# Patient Record
Sex: Male | Born: 1955 | Race: White | Hispanic: No | Marital: Married | Smoking: Former smoker
Health system: Southern US, Community
[De-identification: ages and names within clinical notes are randomized; demographics above are authoritative.]

## PROBLEM LIST (undated history)

## (undated) DIAGNOSIS — E785 Hyperlipidemia, unspecified: Secondary | ICD-10-CM

## (undated) DIAGNOSIS — G43909 Migraine, unspecified, not intractable, without status migrainosus: Secondary | ICD-10-CM

## (undated) DIAGNOSIS — I10 Essential (primary) hypertension: Secondary | ICD-10-CM

## (undated) DIAGNOSIS — S72009A Fracture of unspecified part of neck of unspecified femur, initial encounter for closed fracture: Secondary | ICD-10-CM

## (undated) HISTORY — DX: Fracture of unspecified part of neck of unspecified femur, initial encounter for closed fracture: S72.009A

## (undated) HISTORY — PX: WISDOM TOOTH EXTRACTION: SHX21

## (undated) HISTORY — PX: TONSILLECTOMY: SUR1361

## (undated) HISTORY — DX: Hyperlipidemia, unspecified: E78.5

## (undated) HISTORY — DX: Essential (primary) hypertension: I10

## (undated) HISTORY — DX: Migraine, unspecified, not intractable, without status migrainosus: G43.909

---

## 1983-05-13 HISTORY — PX: VASECTOMY: SHX75

## 1993-05-12 DIAGNOSIS — S72009A Fracture of unspecified part of neck of unspecified femur, initial encounter for closed fracture: Secondary | ICD-10-CM

## 1993-05-12 HISTORY — PX: HIP FRACTURE SURGERY: SHX118

## 1993-05-12 HISTORY — DX: Fracture of unspecified part of neck of unspecified femur, initial encounter for closed fracture: S72.009A

## 2004-02-07 ENCOUNTER — Ambulatory Visit (HOSPITAL_COMMUNITY): Admission: RE | Admit: 2004-02-07 | Discharge: 2004-02-07 | Payer: Self-pay | Admitting: Cardiovascular Disease

## 2015-02-17 ENCOUNTER — Encounter (HOSPITAL_COMMUNITY): Payer: Self-pay | Admitting: Emergency Medicine

## 2015-02-17 ENCOUNTER — Emergency Department (INDEPENDENT_AMBULATORY_CARE_PROVIDER_SITE_OTHER)
Admission: EM | Admit: 2015-02-17 | Discharge: 2015-02-17 | Disposition: A | Discharge status: Home / Self Care | Payer: Managed Care, Other (non HMO) | Source: Home / Self Care

## 2015-02-17 DIAGNOSIS — J0100 Acute maxillary sinusitis, unspecified: Secondary | ICD-10-CM | POA: Diagnosis not present

## 2015-02-17 MED ORDER — KETOROLAC TROMETHAMINE 60 MG/2ML IM SOLN
INTRAMUSCULAR | Status: AC
  Filled 2015-02-17: qty 2

## 2015-02-17 MED ORDER — METHYLPREDNISOLONE 4 MG PO TBPK: ORAL_TABLET | ORAL | Status: DC

## 2015-02-17 MED ORDER — KETOROLAC TROMETHAMINE 60 MG/2ML IM SOLN
60.0000 mg | Freq: Once | INTRAMUSCULAR | Status: AC
  Administered 2015-02-17: 60 mg via INTRAMUSCULAR

## 2015-02-17 MED ORDER — AMOXICILLIN-POT CLAVULANATE 875-125 MG PO TABS: 1.0000 | ORAL_TABLET | Freq: Two times a day (BID) | ORAL | Status: DC

## 2015-02-17 NOTE — Discharge Instructions (Signed)

## 2015-02-17 NOTE — ED Notes (Signed)
The patient presented to the [facility] with a complaint of sinus pressure and pain. The patient stated that he was treated about 2 weeks ago with amoxicillin from a different urgent care and then the symptoms got worse while on vacation and he was given steroids IM as well as oral and more antibiotics that he has finished. The patient stated that the sinus pain and pressure has not subsided.

## 2015-02-17 NOTE — ED Provider Notes (Signed)
CSN: [id number]     Arrival date & time 02/17/15  1331 History   None    Chief Complaint  Patient presents with  . Facial Pain   (Consider location/radiation/quality/duration/timing/severity/associated sxs/prior Treatment) The history is provided by the patient.    History reviewed. No pertinent past medical history. No past surgical history on file. History reviewed. No pertinent family history. Social History  Substance Use Topics  . Smoking status: None  . Smokeless tobacco: None  . Alcohol Use: None    Review of Systems  Constitutional: Negative.   HENT: Positive for facial swelling and sinus pressure.   Eyes: Negative.   Respiratory: Negative.   Cardiovascular: Negative.   Gastrointestinal: Negative.   Endocrine: Negative.   Genitourinary: Negative.   Musculoskeletal: Negative.   Allergic/Immunologic: Negative.   Neurological: Negative.   Hematological: Negative.   Psychiatric/Behavioral: Negative.     Allergies  Review of patient's allergies indicates no known allergies.  Home Medications   Prior to Admission medications   Not on File   Meds Ordered and Administered this Visit  Medications - No data to display  BP 158/87 mmHg  Pulse 72  Temp(Src) 98.2 F (36.8 C) (Oral)  Resp 18  SpO2 96% No data found.   Physical Exam  Constitutional: He is oriented to person, place, and time. He appears well-developed and well-nourished.  HENT:  Head: Normocephalic and atraumatic.  Mouth/Throat: Oropharynx is clear and moist.  Tenderness left frontal and left maxillary sinuses Left peri-orbital swelling  Eyes: Conjunctivae are normal. Pupils are equal, round, and reactive to light.  Neck: Normal range of motion. Neck supple.  Cardiovascular: Normal rate, regular rhythm and normal heart sounds.   Pulmonary/Chest: Effort normal and breath sounds normal.  Abdominal: Bowel sounds are normal.  Neurological: He is alert and oriented to person, place, and time.   Skin: Skin is warm and dry.  Vitals reviewed.   ED Course  Procedures (including critical care time)  Labs Review Labs Reviewed - No data to display  Imaging Review No results found.   Visual Acuity Review  Right Eye Distance:   Left Eye Distance:   Bilateral Distance:    Right Eye Near:   Left Eye Near:    Bilateral Near:         MDM  Sinusitis  Augmentin  po bid x 2 weeks Medrol dose pack as directed Toradol  IM    Deatra Canter, FNP 02/17/15 1601

## 2015-04-16 ENCOUNTER — Encounter: Payer: Self-pay | Admitting: *Deleted

## 2015-04-18 ENCOUNTER — Ambulatory Visit (INDEPENDENT_AMBULATORY_CARE_PROVIDER_SITE_OTHER): Payer: Managed Care, Other (non HMO) | Admitting: Diagnostic Neuroimaging

## 2015-04-18 ENCOUNTER — Encounter: Payer: Self-pay | Admitting: Diagnostic Neuroimaging

## 2015-04-18 VITALS — BP 132/84 | HR 82 | Ht 72.0 in | Wt 224.6 lb

## 2015-04-18 DIAGNOSIS — G44019 Episodic cluster headache, not intractable: Secondary | ICD-10-CM | POA: Diagnosis not present

## 2015-04-18 DIAGNOSIS — G43009 Migraine without aura, not intractable, without status migrainosus: Secondary | ICD-10-CM | POA: Diagnosis not present

## 2015-04-18 MED ORDER — RIZATRIPTAN BENZOATE 10 MG PO TBDP: 10.0000 mg | ORAL_TABLET | ORAL | Status: AC | PRN
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
## 2015-04-18 NOTE — Patient Instructions (Addendum)
Thank you for coming to see [place] at [facility]. I hope we have been able to provide you high quality care today.  You may receive a patient satisfaction survey over the next few weeks. We would appreciate your feedback and comments so that we may continue to improve ourselves and the health of our patients.  - start headache log/diary - continue metoprolol - use rizatriptan as needed for breakthrough headaches; max 2 tabs per day or 8 tabs per month - consider sleep study   ~~~~~~~~~~~~~~~~~~~~~~~~~~~~~~~~~~~~~~~~~~~~~~~~~~~~~~~~~~~~~~~~~  DR. Lilybeth Vien'S GUIDE TO HAPPY AND HEALTHY LIVING These are some of my general health and wellness recommendations. Some of them may apply to you better than others. Please use common sense as you try these suggestions and feel free to ask me any questions.   ACTIVITY/FITNESS Mental, social, emotional and physical stimulation are very important for brain and body health. Try learning a new activity (arts, music, language, sports, games).  Keep moving your body to the best of your abilities. You can do this at home, inside or outside, the park, community center, gym or anywhere you like. Consider a physical therapist or personal trainer to get started. Consider the app Sworkit. Fitness trackers such as smart-watches, smart-phones or Fitbits can help as well.   NUTRITION Eat more plants: colorful vegetables, nuts, seeds and berries.  Eat less sugar, salt, preservatives and processed foods.  Avoid toxins such as cigarettes and alcohol.  Drink water when you are thirsty. Warm water with a slice of lemon is an excellent morning drink to start the day.  Consider these websites for more information The Nutrition Source (https://www.henry-hernandez.biz/) [employer] Nutrition (WindowBlog.ch)   RELAXATION Consider practicing mindfulness meditation or other relaxation techniques such as deep  breathing, prayer, yoga, tai chi, massage. See website mindful.org or the apps Headspace or Calm to help get started.   SLEEP Try to get at least 7-8+ hours sleep per day. Regular exercise and reduced caffeine will help you sleep better. Practice good sleep hygeine techniques. See website sleep.org for more information.   PLANNING Prepare estate planning, living will, healthcare POA documents. Sometimes this is best planned with the help of an attorney. Theconversationproject.org and agingwithdignity.org are excellent resources.

## 2015-05-05 ENCOUNTER — Emergency Department (HOSPITAL_COMMUNITY)
Admission: EM | Admit: 2015-05-05 | Discharge: 2015-05-05 | Disposition: A | Discharge status: Home / Self Care | Payer: Managed Care, Other (non HMO) | Source: Home / Self Care | Attending: Emergency Medicine | Admitting: Emergency Medicine

## 2015-05-05 ENCOUNTER — Encounter (HOSPITAL_COMMUNITY): Payer: Self-pay | Admitting: *Deleted

## 2015-05-05 DIAGNOSIS — J069 Acute upper respiratory infection, unspecified: Secondary | ICD-10-CM

## 2015-05-05 MED ORDER — PREDNISONE 10 MG PO TABS: ORAL_TABLET | ORAL | Status: AC

## 2015-05-05 NOTE — Discharge Instructions (Signed)
You have a cold virus that has been going around. Take an allergy pill, such as Claritin, Allegra, or Zyrtec, daily to help with the congestion. Take the prednisone taper as prescribed. This will help with the inflammation. You can get Afrin over-the-counter. This will relieve congestion very quickly. You can only use it for 3 days. All of these medicines are safe to take with your daily medications. If you are having trouble sleeping at night from a headache or anxiety, try taking a Benadryl tablet. Follow-up as needed.
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
## 2015-05-05 NOTE — ED Notes (Signed)
Pt  Reports   Symptoms  Of  Nasal   Congestion       And  Headache   X   2  Days          Slight     Non  Productive       Cough           sorethroat          Pt  Is  Sitting  Upright   On  Exam table  In no  Distress           Speaking  In    Complete  sentances

## 2015-06-11 ENCOUNTER — Ambulatory Visit: Payer: Managed Care, Other (non HMO) | Admitting: Diagnostic Neuroimaging
